# Patient Record
Sex: Male | Born: 1995 | Race: Black or African American | Hispanic: No | Marital: Single | Smoking: Never smoker
Health system: Southern US, Community
[De-identification: ages and names within clinical notes are randomized; demographics above are authoritative.]

## PROBLEM LIST (undated history)

## (undated) DIAGNOSIS — T7840XA Allergy, unspecified, initial encounter: Secondary | ICD-10-CM

## (undated) HISTORY — DX: Allergy, unspecified, initial encounter: T78.40XA

---
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
## 2017-05-24 ENCOUNTER — Encounter: Payer: Self-pay | Admitting: Physician Assistant

## 2017-05-24 ENCOUNTER — Ambulatory Visit (INDEPENDENT_AMBULATORY_CARE_PROVIDER_SITE_OTHER): Payer: BLUE CROSS/BLUE SHIELD

## 2017-05-24 ENCOUNTER — Ambulatory Visit (INDEPENDENT_AMBULATORY_CARE_PROVIDER_SITE_OTHER): Payer: BLUE CROSS/BLUE SHIELD | Admitting: Physician Assistant

## 2017-05-24 VITALS — BP 138/76 | HR 108 | Temp 103.0°F | Resp 18 | Wt 199.8 lb

## 2017-05-24 DIAGNOSIS — H579 Unspecified disorder of eye and adnexa: Secondary | ICD-10-CM

## 2017-05-24 DIAGNOSIS — H5789 Other specified disorders of eye and adnexa: Secondary | ICD-10-CM

## 2017-05-24 DIAGNOSIS — J029 Acute pharyngitis, unspecified: Secondary | ICD-10-CM

## 2017-05-24 DIAGNOSIS — L299 Pruritus, unspecified: Secondary | ICD-10-CM

## 2017-05-24 DIAGNOSIS — R509 Fever, unspecified: Secondary | ICD-10-CM | POA: Diagnosis not present

## 2017-05-24 DIAGNOSIS — J181 Lobar pneumonia, unspecified organism: Secondary | ICD-10-CM

## 2017-05-24 DIAGNOSIS — R05 Cough: Secondary | ICD-10-CM

## 2017-05-24 DIAGNOSIS — R059 Cough, unspecified: Secondary | ICD-10-CM

## 2017-05-24 DIAGNOSIS — J189 Pneumonia, unspecified organism: Secondary | ICD-10-CM

## 2017-05-24 DIAGNOSIS — R0981 Nasal congestion: Secondary | ICD-10-CM

## 2017-05-24 LAB — POCT CBC
Granulocyte percent: 83.3 %G — AB (ref 37–80)
HCT, POC: 40.2 % — AB (ref 43.5–53.7)
Hemoglobin: 13.7 g/dL — AB (ref 14.1–18.1)
Lymph, poc: 0.5 — AB (ref 0.6–3.4)
MCH, POC: 30 pg (ref 27–31.2)
MCHC: 34 g/dL (ref 31.8–35.4)
MCV: 88.2 fL (ref 80–97)
MID (cbc): 0.3 (ref 0–0.9)
MPV: 7.9 fL (ref 0–99.8)
POC Granulocyte: 4.1 (ref 2–6.9)
POC LYMPH PERCENT: 10.6 %L (ref 10–50)
POC MID %: 6.1 %M (ref 0–12)
Platelet Count, POC: 148 10*3/uL (ref 142–424)
RBC: 4.56 M/uL — AB (ref 4.69–6.13)
RDW, POC: 11.5 %
WBC: 4.9 10*3/uL (ref 4.6–10.2)

## 2017-05-24 LAB — POCT RAPID STREP A (OFFICE): Rapid Strep A Screen: NEGATIVE

## 2017-05-24 LAB — POC INFLUENZA A&B (BINAX/QUICKVUE)
Influenza A, POC: NEGATIVE
Influenza B, POC: NEGATIVE

## 2017-05-24 MED ORDER — ACETAMINOPHEN 500 MG PO TABS
1000.0000 mg | ORAL_TABLET | Freq: Once | ORAL | Status: AC
  Administered 2017-05-24: 1000 mg via ORAL

## 2017-05-24 MED ORDER — HYDROCODONE-HOMATROPINE 5-1.5 MG/5ML PO SYRP: 5.0000 mL | ORAL_SOLUTION | Freq: Three times a day (TID) | ORAL | 0 refills | Status: AC | PRN

## 2017-05-24 MED ORDER — AZITHROMYCIN 250 MG PO TABS: ORAL_TABLET | ORAL | 0 refills | Status: AC

## 2017-05-24 MED ORDER — BENZONATATE 100 MG PO CAPS: 100.0000 mg | ORAL_CAPSULE | Freq: Three times a day (TID) | ORAL | 0 refills | Status: AC | PRN

## 2017-05-24 NOTE — Patient Instructions (Addendum)
You have right lower lobe pneumonia. Please take the entire course of antibiotics.  Hycodan is for cough at night. Please do not abuse this medication. Take it as directed.  Tessalon is for cough during the day.   For allergies: Flonase, Claritin D (you have to buy this from pharmacist), OpCon-A  Thank you for coming in today. I hope you feel we met your needs.  Feel free to call [facility] if you have any questions or further requests.  Please consider signing up for MyChart if you do not already have it, as this is a great way to communicate with me.  Best,  Whitney McVey, PA-C   Community-Acquired Pneumonia, Adult Pneumonia is an infection of the lungs. There are different types of pneumonia. One type can develop while a person is in a hospital. A different type, called community-acquired pneumonia, develops in people who are not, or have not recently been, in the hospital or other health care facility. What are the causes? Pneumonia may be caused by bacteria, viruses, or funguses. Community-acquired pneumonia is often caused by Streptococcus pneumonia bacteria. These bacteria are often passed from one person to another by breathing in droplets from the cough or sneeze of an infected person. What increases the risk? The condition is more likely to develop in:  People who havechronic diseases, such as chronic obstructive pulmonary disease (COPD), asthma, congestive heart failure, cystic fibrosis, diabetes, or kidney disease.  People who haveearly-stage or late-stage HIV.  People who havesickle cell disease.  People who havehad their spleen removed (splenectomy).  People who havepoor Human resources officer.  People who havemedical conditions that increase the risk of breathing in (aspirating) secretions their own mouth and nose.  People who havea weakened immune system (immunocompromised).  People who smoke.  People whotravel to areas where pneumonia-causing germs commonly  exist.  People whoare around animal habitats or animals that have pneumonia-causing germs, including birds, bats, rabbits, cats, and farm animals.  What are the signs or symptoms? Symptoms of this condition include:  Adry cough.  A wet (productive) cough.  Fever.  Sweating.  Chest pain, especially when breathing deeply or coughing.  Rapid breathing or difficulty breathing.  Shortness of breath.  Shaking chills.  Fatigue.  Muscle aches.  How is this diagnosed? Your health care provider will take a medical history and perform a physical exam. You may also have other tests, including:  Imaging studies of your chest, including X-rays.  Tests to check your blood oxygen level and other blood gases.  Other tests on blood, mucus (sputum), fluid around your lungs (pleural fluid), and urine.  If your pneumonia is severe, other tests may be done to identify the specific cause of your illness. How is this treated? The type of treatment that you receive depends on many factors, such as the cause of your pneumonia, the medicines you take, and other medical conditions that you have. For most adults, treatment and recovery from pneumonia may occur at home. In some cases, treatment must happen in a hospital. Treatment may include:  Antibiotic medicines, if the pneumonia was caused by bacteria.  Antiviral medicines, if the pneumonia was caused by a virus.  Medicines that are given by mouth or through an IV tube.  Oxygen.  Respiratory therapy.  Although rare, treating severe pneumonia may include:  Mechanical ventilation. This is done if you are not breathing well on your own and you cannot maintain a safe blood oxygen level.  Thoracentesis. This procedureremoves fluid around one lung  or both lungs to help you breathe better.  Follow these instructions at home:  Take over-the-counter and prescription medicines only as told by your health care provider. ? Only takecough  medicine if you are losing sleep. Understand that cough medicine can prevent your body's natural ability to remove mucus from your lungs. ? If you were prescribed an antibiotic medicine, take it as told by your health care provider. Do not stop taking the antibiotic even if you start to feel better.  Sleep in a semi-upright position at night. Try sleeping in a reclining chair, or place a few pillows under your head.  Do not use tobacco products, including cigarettes, chewing tobacco, and e-cigarettes. If you need help quitting, ask your health care provider.  Drink enough water to keep your urine clear or pale yellow. This will help to thin out mucus secretions in your lungs. How is this prevented? There are ways that you can decrease your risk of developing community-acquired pneumonia. Consider getting a pneumococcal vaccine if:  You are older than 20 years of age.  You are older than 20 years of age and are undergoing cancer treatment, have chronic lung disease, or have other medical conditions that affect your immune system. Ask your health care provider if this applies to you.  There are different types and schedules of pneumococcal vaccines. Ask your health care provider which vaccination option is best for you. You may also prevent community-acquired pneumonia if you take these actions:  Get an influenza vaccine every year. Ask your health care provider which type of influenza vaccine is best for you.  Go to the dentist on a regular basis.  Wash your hands often. Use hand sanitizer if soap and water are not available.  Contact a health care provider if:  You have a fever.  You are losing sleep because you cannot control your cough with cough medicine. Get help right away if:  You have worsening shortness of breath.  You have increased chest pain.  Your sickness becomes worse, especially if you are an older adult or have a weakened immune system.  You cough up blood. This  information is not intended to replace advice given to you by your health care provider. Make sure you discuss any questions you have with your health care provider. Document Released: 12/05/2005 Document Revised: 04/14/2016 Document Reviewed: 04/01/2015 Elsevier Interactive Patient Education  2017 [employer].  IF you received an x-ray today, you will receive an invoice from [facility]. Please contact [facility] at [phone] with questions or concerns regarding your invoice.   IF you received labwork today, you will receive an invoice from [facility]. Please contact [facility] at [phone] with questions or concerns regarding your invoice.   Our billing staff will not be able to assist you with questions regarding bills from these companies.  You will be contacted with the lab results as soon as they are available. The fastest way to get your results is to activate your My Chart account. Instructions are located on the last page of this paperwork. If you have not heard from [place] regarding the results in 2 weeks, please contact this office.

## 2017-05-24 NOTE — Progress Notes (Signed)
Jack CrazeSamuel Henle  MRN: [id number] DOB: 07/20/1996  PCP: Patient, No Pcp Per  Subjective:  Pt is a 21 year old male no past medical history who presents to clinic for fever and chills x 6 days. Symptoms started with chills, headache and fever. The next day developed a small cough which has progressively worsened.   Cough is productive and described as "burning". One episode of sweating yesterday.  Headache and pressure Increased fatigue and weakness Decreased appetite Not sleeping well due to cough A few episodes of diarrhea preceded by stomach ache.  His temp today is 103. He has been taking ibuprofen He did not have flu shot  No known sick contacts. No recent travel.   Review of Systems  Constitutional: Positive for chills, diaphoresis, fatigue and fever.  Respiratory: Positive for cough. Negative for chest tightness, shortness of breath and wheezing.   Gastrointestinal: Positive for abdominal pain and diarrhea.  Musculoskeletal: Positive for back pain.  Skin: Negative.   Neurological: Positive for headaches. Negative for dizziness and light-headedness.    There are no active problems to display for this patient.   No current outpatient prescriptions on file prior to visit.   No current facility-administered medications on file prior to visit.     No Known Allergies   Objective:  BP 138/76   Pulse (!) 108   Temp (!) 103 F (39.4 C) (Oral)   Resp 18   Wt 199 lb 12.8 oz (90.6 kg)   SpO2 96%   Physical Exam  Constitutional: He is oriented to person, place, and time and well-developed, well-nourished, and in no distress. No distress.  HENT:  Right Ear: Tympanic membrane normal.  Left Ear: Tympanic membrane normal.  Nose: Mucosal edema and rhinorrhea present. Right sinus exhibits no maxillary sinus tenderness and no frontal sinus tenderness. Left sinus exhibits no maxillary sinus tenderness and no frontal sinus tenderness.  Mouth/Throat: Oropharynx is clear and  moist and mucous membranes are normal.  Cardiovascular: Normal rate, regular rhythm and normal heart sounds.   Pulmonary/Chest: Effort normal. He has no wheezes. He has no rales.  Neurological: He is alert and oriented to person, place, and time. GCS score is 15.  Skin: Skin is warm and dry.  Psychiatric: Mood, memory, affect and judgment normal.  Vitals reviewed.   Results for orders placed or performed in visit on 05/24/17  POC Influenza A&B(BINAX/QUICKVUE)  Result Value Ref Range   Influenza A, POC Negative Negative   Influenza B, POC Negative Negative  POCT rapid strep A  Result Value Ref Range   Rapid Strep A Screen Negative Negative  POCT CBC  Result Value Ref Range   WBC 4.9 4.6 - 10.2 K/uL   Lymph, poc 0.5 (A) 0.6 - 3.4   POC LYMPH PERCENT 10.6 10 - 50 %L   MID (cbc) 0.3 0 - 0.9   POC MID % 6.1 0 - 12 %M   POC Granulocyte 4.1 2 - 6.9   Granulocyte percent 83.3 (A) 37 - 80 %G   RBC 4.56 (A) 4.69 - 6.13 M/uL   Hemoglobin 13.7 (A) 14.1 - 18.1 g/dL   HCT, POC 40.940.2 (A) 81.143.5 - 53.7 %   MCV 88.2 80 - 97 fL   MCH, POC 30.0 27 - 31.2 pg   MCHC 34.0 31.8 - 35.4 g/dL   RDW, POC 91.411.5 %   Platelet Count, POC 148 142 - 424 K/uL   MPV 7.9 0 - 99.8 fL   Dg Chest  2 View  Result Date: 05/24/2017 CLINICAL DATA:  Fever, chills for 6 days.  Productive cough. EXAM: CHEST  2 VIEW COMPARISON:  None. FINDINGS: Consolidation in the right lower lobe compatible with pneumonia. Left lung is clear. Heart is normal size. No effusions or acute bony abnormality. IMPRESSION: Right lower lobe pneumonia. Electronically Signed   By: Charlett Nose M.D.   On: 05/24/2017 09:39   Assessment and Plan :  1. Pneumonia of right lower lobe due to infectious organism (HCC) 2. Fever, unspecified fever cause 3. Sore throat 4. Cough - azithromycin (ZITHROMAX) 250 MG tablet; Take 2 tabs PO x 1 dose, then 1 tab PO QD x 4 days  Dispense: 6 tablet; Refill: 0 - POC Influenza A&B(BINAX/QUICKVUE) - acetaminophen  (TYLENOL) tablet 1,000 mg; Take 2 tablets (1,000 mg total) by mouth once. - POCT CBC - POCT rapid strep A - DG Chest 2 View; Future - benzonatate (TESSALON) 100 MG capsule; Take 1-2 capsules (100-200 mg total) by mouth 3 (three) times daily as needed for cough.  Dispense: 40 capsule; Refill: 0 - HYDROcodone-homatropine (HYCODAN) 5-1.5 MG/5ML syrup; Take 5 mLs by mouth every 8 (eight) hours as needed for cough.  Dispense: 120 mL; Refill: 0 - X-ray positive for pneumonia. Plan to treat with Azithromycin. Encouraged rest and push fluids. in 5-7 days if no improvement.   [name], PA-C  [facility] 05/24/2017 8:39 AM

## 2017-05-26 LAB — CULTURE, GROUP A STREP

## 2017-05-29 MED ORDER — FLUTICASONE PROPIONATE 50 MCG/ACT NA SUSP: 2.0000 | Freq: Every day | NASAL | 6 refills | Status: AC

## 2017-05-29 MED ORDER — NAPHAZOLINE-PHENIRAMINE 0.025-0.3 % OP SOLN: 1.0000 [drp] | Freq: Four times a day (QID) | OPHTHALMIC | 0 refills | Status: AC | PRN

## 2017-05-29 MED ORDER — HYDROXYZINE HCL 25 MG PO TABS: 25.0000 mg | ORAL_TABLET | Freq: Three times a day (TID) | ORAL | 0 refills | Status: AC | PRN

## 2017-05-29 NOTE — Addendum Note (Signed)
Addended by: Sebastian AcheMCVEY, Aitana Burry WHITNEY on: 05/29/2017 04:03 PM   Modules accepted: Orders

## 2017-05-29 NOTE — Progress Notes (Signed)
Spoke with pt. His is 80% better. Sore throat is gone. C/o lingering dry cough, red itchy eyes, and intermittent urticarial rash x 1 day. Will treat with Atarax, Naphcon-A and flonose. RTC if symptoms do not improve.

## 2018-11-02 DIAGNOSIS — M79675 Pain in left toe(s): Secondary | ICD-10-CM | POA: Diagnosis not present

## 2019-01-08 DIAGNOSIS — M79675 Pain in left toe(s): Secondary | ICD-10-CM | POA: Diagnosis not present

## 2019-01-08 DIAGNOSIS — M79672 Pain in left foot: Secondary | ICD-10-CM | POA: Diagnosis not present

## 2019-01-08 DIAGNOSIS — M7989 Other specified soft tissue disorders: Secondary | ICD-10-CM | POA: Diagnosis not present

## 2019-04-03 DIAGNOSIS — H5210 Myopia, unspecified eye: Secondary | ICD-10-CM | POA: Diagnosis not present

## 2019-04-03 DIAGNOSIS — H52223 Regular astigmatism, bilateral: Secondary | ICD-10-CM | POA: Diagnosis not present

## 2019-04-03 DIAGNOSIS — H538 Other visual disturbances: Secondary | ICD-10-CM | POA: Diagnosis not present

## 2019-08-09 DIAGNOSIS — Z20828 Contact with and (suspected) exposure to other viral communicable diseases: Secondary | ICD-10-CM | POA: Diagnosis not present
